# Patient Record
Sex: Male | Born: 1980 | Hispanic: No | Marital: Married | Smoking: Never smoker
Health system: Southern US, Community
[De-identification: ages and names within clinical notes are randomized; demographics above are authoritative.]

## PROBLEM LIST (undated history)

## (undated) DIAGNOSIS — R569 Unspecified convulsions: Secondary | ICD-10-CM

## (undated) DIAGNOSIS — G40909 Epilepsy, unspecified, not intractable, without status epilepticus: Secondary | ICD-10-CM

## (undated) DIAGNOSIS — I3139 Other pericardial effusion (noninflammatory): Secondary | ICD-10-CM

## (undated) DIAGNOSIS — I313 Pericardial effusion (noninflammatory): Secondary | ICD-10-CM

## (undated) DIAGNOSIS — A159 Respiratory tuberculosis unspecified: Secondary | ICD-10-CM

## (undated) HISTORY — PX: OTHER SURGICAL HISTORY: SHX169

---

## 2013-06-28 ENCOUNTER — Emergency Department (HOSPITAL_COMMUNITY)
Admission: EM | Admit: 2013-06-28 | Discharge: 2013-06-28 | Disposition: A | Discharge status: Home / Self Care | Payer: Self-pay | Attending: Emergency Medicine | Admitting: Emergency Medicine

## 2013-06-28 ENCOUNTER — Encounter (HOSPITAL_COMMUNITY): Payer: Self-pay | Admitting: Emergency Medicine

## 2013-06-28 ENCOUNTER — Emergency Department (HOSPITAL_COMMUNITY): Payer: Self-pay

## 2013-06-28 DIAGNOSIS — G40909 Epilepsy, unspecified, not intractable, without status epilepticus: Secondary | ICD-10-CM | POA: Insufficient documentation

## 2013-06-28 DIAGNOSIS — R569 Unspecified convulsions: Secondary | ICD-10-CM

## 2013-06-28 DIAGNOSIS — Z79899 Other long term (current) drug therapy: Secondary | ICD-10-CM | POA: Insufficient documentation

## 2013-06-28 DIAGNOSIS — R000 Tachycardia, unspecified: Secondary | ICD-10-CM | POA: Insufficient documentation

## 2013-06-28 HISTORY — DX: Epilepsy, unspecified, not intractable, without status epilepticus: G40.909

## 2013-06-28 LAB — COMPREHENSIVE METABOLIC PANEL
ALT: 76 U/L — ABNORMAL HIGH (ref 0–53)
AST: 102 U/L — ABNORMAL HIGH (ref 0–37)
Albumin: 3.8 g/dL (ref 3.5–5.2)
Alkaline Phosphatase: 101 U/L (ref 39–117)
CO2: 26 mEq/L (ref 19–32)
Chloride: 108 mEq/L (ref 96–112)
GFR calc non Af Amer: >90 mL/min (ref >90)
Potassium: 3.9 mEq/L (ref 3.5–5.1)
Total Bilirubin: 0.2 mg/dL — ABNORMAL LOW (ref 0.3–1.2)

## 2013-06-28 MED ORDER — SODIUM CHLORIDE 0.9 % IV SOLN
INTRAVENOUS | Status: DC
  Administered 2013-06-28: 21:00:00 via INTRAVENOUS

## 2013-06-28 NOTE — ED Notes (Signed)
Bed: WA12 Expected date:  Expected time:  Means of arrival:  Comments: EMS-syncopal episode 

## 2013-06-28 NOTE — ED Notes (Signed)
Pt BIB EMS. Pt was at home and became altered and nonverbal per EMS. Pt has hx of seizures. No seizure activity noted per EMS. Pt has odor of ETOH on breath. Pt a/o x 4 at present. Pt denies pain. Pt with no acute distress. Skin warm, dry.

## 2013-06-28 NOTE — ED Notes (Signed)
MD at bedside. Dr. Allen at bedside.  

## 2013-06-28 NOTE — ED Provider Notes (Signed)
CSN: [id number]     Arrival date & time 06/28/13  2019 History   First MD Initiated Contact with Patient 06/28/13 2036     Chief Complaint  Patient presents with  . Altered Mental Status   (Consider location/radiation/quality/duration/timing/severity/associated sxs/prior Treatment) Patient is a 32 y.o. male presenting with altered mental status. The history is provided by the patient.  Altered Mental Status  patient here after having what sounds like a seizure prior to arrival. Course the history of seizures. He does take Keppra. No fever or chills. Denies alcohol or illicit drug use. No severe headaches noted. Has had multiple episodes of going unresponsive with a postictal period. Patient denies any recent history of head trauma. No vomiting or neck pain. No photophobia noted. EMS was called and patient presented here. No treatment given prior to arrival.  Past Medical History  Diagnosis Date  . Seizure disorder    No past surgical history on file. No family history on file. History  Substance Use Topics  . Smoking status: Not on file  . Smokeless tobacco: Not on file  . Alcohol Use: Not on file    Review of Systems  All other systems reviewed and are negative.    Allergies  Review of patient's allergies indicates no known allergies.  Home Medications   Current Outpatient Rx  Name  Route  Sig  Dispense  Refill  . levETIRAcetam (KEPPRA) 500 MG tablet   Oral   Take 500 mg by mouth 2 (two) times daily.         . Pseudoeph-Doxylamine-DM-APAP (NYQUIL PO)   Oral   Take 30 mLs by mouth every 6 (six) hours.          BP 122/88  Pulse 107  Temp(Src) 98.3 F (36.8 C) (Oral)  Resp 16  SpO2 100% Physical Exam  Nursing note and vitals reviewed. Constitutional: He is oriented to person, place, and time. He appears well-developed and well-nourished.  Non-toxic appearance. No distress.  HENT:  Head: Normocephalic and atraumatic.  Eyes: Conjunctivae, EOM and lids are  normal. Pupils are equal, round, and reactive to light.  Neck: Normal range of motion. Neck supple. No tracheal deviation present. No mass present.  Cardiovascular: Regular rhythm and normal heart sounds.  Tachycardia present.  Exam reveals no gallop.   No murmur heard. Pulmonary/Chest: Effort normal and breath sounds normal. No stridor. No respiratory distress. He has no decreased breath sounds. He has no wheezes. He has no rhonchi. He has no rales.  Abdominal: Soft. Normal appearance and bowel sounds are normal. He exhibits no distension. There is no tenderness. There is no rebound and no CVA tenderness.  Musculoskeletal: Normal range of motion. He exhibits no edema and no tenderness.  Neurological: He is alert and oriented to person, place, and time. He has normal strength. No cranial nerve deficit or sensory deficit. GCS eye subscore is 4. GCS verbal subscore is 5. GCS motor subscore is 6.  Skin: Skin is warm and dry. No abrasion and no rash noted.  Psychiatric: He has a normal mood and affect. His speech is normal and behavior is normal.    ED Course  Procedures (including critical care time) Labs Review Labs Reviewed - No data to display Imaging Review No results found.  EKG Interpretation   None       MDM  No diagnosis found. 9:10 PM Patients family is now here and he does have a history of seizure disorder. I reviewed a report of  the brain MRI that was performed 2 weeks ago and the patient is being currently evaluated for MS and he is on Keppra and has been compliant with his medications.  10:44 PM Patient without seizure activity here. I have expressed to him take an extra dose of this medication I before he goes to bed and to followup with Dr. next week  Toy Baker, MD 06/28/13 2245

## 2014-10-22 ENCOUNTER — Emergency Department (HOSPITAL_COMMUNITY): Payer: Self-pay

## 2014-10-22 ENCOUNTER — Encounter (HOSPITAL_COMMUNITY): Payer: Self-pay | Admitting: Emergency Medicine

## 2014-10-22 ENCOUNTER — Observation Stay (HOSPITAL_COMMUNITY)
Admission: EM | Admit: 2014-10-22 | Discharge: 2014-10-23 | Disposition: A | Discharge status: Home / Self Care | Payer: Self-pay | Attending: Internal Medicine | Admitting: Internal Medicine

## 2014-10-22 DIAGNOSIS — R531 Weakness: Secondary | ICD-10-CM | POA: Insufficient documentation

## 2014-10-22 DIAGNOSIS — G40909 Epilepsy, unspecified, not intractable, without status epilepticus: Secondary | ICD-10-CM | POA: Insufficient documentation

## 2014-10-22 DIAGNOSIS — I3139 Other pericardial effusion (noninflammatory): Secondary | ICD-10-CM | POA: Diagnosis present

## 2014-10-22 DIAGNOSIS — Z7952 Long term (current) use of systemic steroids: Secondary | ICD-10-CM | POA: Insufficient documentation

## 2014-10-22 DIAGNOSIS — I313 Pericardial effusion (noninflammatory): Secondary | ICD-10-CM | POA: Insufficient documentation

## 2014-10-22 DIAGNOSIS — E876 Hypokalemia: Secondary | ICD-10-CM | POA: Insufficient documentation

## 2014-10-22 DIAGNOSIS — R079 Chest pain, unspecified: Secondary | ICD-10-CM | POA: Diagnosis present

## 2014-10-22 DIAGNOSIS — Z79899 Other long term (current) drug therapy: Secondary | ICD-10-CM | POA: Insufficient documentation

## 2014-10-22 DIAGNOSIS — I10 Essential (primary) hypertension: Secondary | ICD-10-CM | POA: Insufficient documentation

## 2014-10-22 DIAGNOSIS — R569 Unspecified convulsions: Secondary | ICD-10-CM

## 2014-10-22 DIAGNOSIS — R55 Syncope and collapse: Principal | ICD-10-CM | POA: Insufficient documentation

## 2014-10-22 HISTORY — DX: Pericardial effusion (noninflammatory): I31.3

## 2014-10-22 HISTORY — DX: Unspecified convulsions: R56.9

## 2014-10-22 HISTORY — DX: Respiratory tuberculosis unspecified: A15.9

## 2014-10-22 HISTORY — DX: Other pericardial effusion (noninflammatory): I31.39

## 2014-10-22 LAB — CBC WITH DIFFERENTIAL/PLATELET
BASOS ABS: 0 10*3/uL (ref 0.0–0.1)
BASOS PCT: 0 % (ref 0–1)
Eosinophils Absolute: 0 10*3/uL (ref 0.0–0.7)
Eosinophils Relative: 0 % (ref 0–5)
HCT: 42.9 % (ref 39.0–52.0)
Hemoglobin: 14.3 g/dL (ref 13.0–17.0)
LYMPHS PCT: 23 % (ref 12–46)
Lymphs Abs: 1.7 10*3/uL (ref 0.7–4.0)
MCH: 29.9 pg (ref 26.0–34.0)
MCHC: 33.3 g/dL (ref 30.0–36.0)
MCV: 89.6 fL (ref 78.0–100.0)
MONO ABS: 0.6 10*3/uL (ref 0.1–1.0)
Monocytes Relative: 8 % (ref 3–12)
Neutro Abs: 5 10*3/uL (ref 1.7–7.7)
Neutrophils Relative %: 69 % (ref 43–77)
Platelets: 253 10*3/uL (ref 150–400)
RBC: 4.79 MIL/uL (ref 4.22–5.81)
RDW: 13.9 % (ref 11.5–15.5)
WBC: 7.3 10*3/uL (ref 4.0–10.5)

## 2014-10-22 LAB — TROPONIN I

## 2014-10-22 LAB — BASIC METABOLIC PANEL
Anion gap: 13 (ref 5–15)
BUN: 17 mg/dL (ref 6–23)
CALCIUM: 9.2 mg/dL (ref 8.4–10.5)
CO2: 23 mmol/L (ref 19–32)
CREATININE: 0.74 mg/dL (ref 0.50–1.35)
Chloride: 107 mmol/L (ref 96–112)
GFR calc non Af Amer: >90 mL/min (ref >90)
GLUCOSE: 105 mg/dL — AB (ref 70–99)
Potassium: 3.8 mmol/L (ref 3.5–5.1)
Sodium: 143 mmol/L (ref 135–145)

## 2014-10-22 LAB — D-DIMER, QUANTITATIVE (NOT AT ARMC): D DIMER QUANT: 3.27 ug{FEU}/mL — AB (ref 0.00–0.48)

## 2014-10-22 MED ORDER — SODIUM CHLORIDE 0.9 % IV BOLUS (SEPSIS)
1000.0000 mL | Freq: Once | INTRAVENOUS | Status: AC
  Administered 2014-10-22: 1000 mL via INTRAVENOUS

## 2014-10-22 MED ORDER — IOHEXOL 350 MG/ML SOLN
100.0000 mL | Freq: Once | INTRAVENOUS | Status: AC | PRN
  Administered 2014-10-22: 100 mL via INTRAVENOUS

## 2014-10-22 NOTE — ED Notes (Signed)
Pt A&O x 4 at this time, denies any complaints at this time. Pt does report feeling nauseous early this morning and yesterday morning when he first woke up. Pt also reports felt some L chest pain earlier today but it resolved after a few hours. Denies SOB, no nausea or pain at this time. Denies lightheadedness/ dizziness. Able to follow commands.

## 2014-10-22 NOTE — ED Notes (Signed)
Bed: WA03 Expected date:  Expected time:  Means of arrival:  Comments: EMS-seizure 

## 2014-10-22 NOTE — ED Notes (Signed)
Per EMS: Per pt's wife, pt was lying down on floor when he started shaking. Shaking lasted about 5 minutes. Then pt sat up and threw up one time. Pt not post-ictal upon EMS arrival. Pt ambulated to ambulance. Pt's first language is Indie, speaks limited [place].

## 2014-10-22 NOTE — ED Provider Notes (Signed)
CSN: [id number]     Arrival date & time 10/22/14  1835 History   First MD Initiated Contact with Patient 10/22/14 1857     Chief Complaint  Patient presents with  . Seizures     (Consider location/radiation/quality/duration/timing/severity/associated sxs/prior Treatment) HPI  34 year old male presents after a syncopal episode. According to him he was in an argument with a friend and felt lightheaded and had blurry vision. He then passed out. He had transient shaking as well. Patient states this feels different than his typical seizure disorder which is treated with Keppra. He also complains of left-sided chest pain intermittently for the past 2 months. Initially stated he did not have chest pain for the past 2 weeks but friend knows that he was complaining of chest pain around the time of his passing out. Denies any symptoms now states he feels normal. States [facility] and had to have fluid drawn off his heart. He presents to be some type work shows that he had cardiac Denied. Patient states this feels different than that.  Past Medical History  Diagnosis Date  . Seizure disorder    History reviewed. No pertinent past surgical history. No family history on file. History  Substance Use Topics  . Smoking status: Never Smoker   . Smokeless tobacco: Not on file  . Alcohol Use: No    Review of Systems  Constitutional: Negative for fever.  Respiratory: Negative for shortness of breath.   Cardiovascular: Positive for chest pain.  Gastrointestinal: Positive for vomiting. Negative for abdominal pain.  Neurological: Positive for syncope and light-headedness. Negative for weakness.  All other systems reviewed and are negative.     Allergies  Review of patient's allergies indicates no known allergies.  Home Medications   Prior to Admission medications   Medication Sig Start Date End Date Taking? Authorizing Provider  colchicine 0.6 MG tablet Take 0.6 mg by mouth  daily.   Yes Historical Provider, MD  levETIRAcetam (KEPPRA) 500 MG tablet Take 500-1,000 mg by mouth 2 (two) times daily. Take 1 tablet in the morning and Take 2 tablets in the pm.   Yes Historical Provider, MD  lisinopril (PRINIVIL,ZESTRIL) 5 MG tablet Take 2.5 mg by mouth daily.   Yes Historical Provider, MD  metoprolol (LOPRESSOR) 50 MG tablet Take 50 mg by mouth 2 (two) times daily.   Yes Historical Provider, MD  predniSONE (DELTASONE) 10 MG tablet Take 20 mg by mouth daily with breakfast.   Yes Historical Provider, MD  Pseudoeph-Doxylamine-DM-APAP (NYQUIL PO) Take 30 mLs by mouth every 8 (eight) hours.    Yes Historical Provider, MD   BP 101/64 mmHg  Pulse 110  Temp(Src) 98.4 F (36.9 C) (Oral)  Resp 22  SpO2 94% Physical Exam  Constitutional: He is oriented to person, place, and time. He appears well-developed and well-nourished.  HENT:  Head: Normocephalic and atraumatic.  Right Ear: External ear normal.  Left Ear: External ear normal.  Nose: Nose normal.  Eyes: EOM are normal. Pupils are equal, round, and reactive to light. Right eye exhibits no discharge. Left eye exhibits no discharge.  Neck: Neck supple.  Cardiovascular: Normal rate, regular rhythm, normal heart sounds and intact distal pulses.   Pulmonary/Chest: Effort normal and breath sounds normal. He has no wheezes. He has no rales.  Abdominal: Soft. He exhibits no distension. There is no tenderness.  Musculoskeletal: He exhibits no edema.  Neurological: He is alert and oriented to person, place, and time.  CN 2-12 grossly intact. 5/5 strength in all 4 extremities  Skin: Skin is warm and dry.  Nursing note and vitals reviewed.   ED Course  Procedures (including critical care time) Labs Review Labs Reviewed  BASIC METABOLIC PANEL - Abnormal; Notable for the following:    Glucose, Bld 105 (*)    All other components within normal limits  D-DIMER, QUANTITATIVE - Abnormal; Notable for the following:    D-Dimer,  Quant 3.27 (*)    All other components within normal limits  CBC WITH DIFFERENTIAL/PLATELET  TROPONIN I    Imaging Review Dg Chest 2 View  10/22/2014   CLINICAL DATA:  Syncope.  Emesis.  EXAM: CHEST  2 VIEW  COMPARISON:  None.  FINDINGS: The cardiomediastinal silhouette is within normal limits. The lungs are well inflated. There is mild, asymmetric parenchymal lung opacity in the right lung apex, likely with some associated pleural thickening. There is blunting of the left costophrenic angle consistent with a small left pleural effusion. No left lung consolidation is seen. No pneumothorax. No acute osseous abnormality identified.  IMPRESSION: 1. Small left pleural effusion. 2. Asymmetric, mild pleural parenchymal opacities in the right lung apex, most likely scarring related to prior infection. Attention on follow-up. Comparison with any prior studies, if available, would also be helpful to assess stability.   Electronically Signed   By: Sebastian Ache   On: 10/22/2014 20:11   Ct Angio Chest Pe W/cm &/or Wo Cm  10/22/2014   CLINICAL DATA:  Syncopal episode today. Irregular heartbeat and low blood pressure. History of pericardial effusion.  EXAM: CT ANGIOGRAPHY CHEST WITH CONTRAST  TECHNIQUE: Multidetector CT imaging of the chest was performed using the standard protocol during bolus administration of intravenous contrast. Multiplanar CT image reconstructions and MIPs were obtained to evaluate the vascular anatomy.  CONTRAST:  OMNIPAQUE IOHEXOL 350 MG/ML SOLN  COMPARISON:  Chest radiographs earlier today  FINDINGS: Pulmonary arterial opacification is adequate without evidence of emboli. Heart is normal in size. There is a small pericardial effusion. There is a small left pleural effusion. No enlarged axillary lymph nodes are identified. Small calcified mediastinal lymph nodes are noted. No enlarged hilar lymph nodes are seen.  There is rather extensive tree-in-bud nodularity in the apical and posterior  segments of the right upper lobe. There is also some nodular peribronchovascular opacity more centrally in the right upper lobe, and there are branching, tubular densities more peripherally in the apical right upper lobe suggestive of regions of mucoid impaction with associated bronchiolectasis. Two 5 mm nodules are noted along the right major and right minor fissures (series 11, images 48 and 50). A small area of tree-in-bud nodularity is noted in the anterior left upper lobe. Left lower lobe opacity along the left hemidiaphragm likely represents atelectasis.  Visualized portion of the upper abdomen is unremarkable. No acute osseous abnormality is identified.  Review of the MIP images confirms the above findings.  IMPRESSION: 1. No evidence of pulmonary emboli. 2. Areas of mucoid impaction and tree-in-bud nodularity involving the right greater than left upper lobes potentially reflecting endobronchial infection. 3. Small left pleural effusion. 4. Small pericardial effusion.   Electronically Signed   By: Sebastian Ache   On: 10/22/2014 22:43     EKG Interpretation   Date/Time:  Monday October 22 2014 18:42:33 EDT Ventricular Rate:  118 PR Interval:  114 QRS Duration: 85 QT Interval:  299 QTC Calculation: 419 R Axis:   78 Text Interpretation:  Sinus tachycardia  Abnormal T, consider ischemia,  diffuse leads No old tracing to compare Confirmed by Kenneth Cuaresma  MD, Nadege Carriger  ([id number]) on 10/22/2014 6:59:10 PM      MDM   Final diagnoses:  Syncope, unspecified syncope type    Patient stable here, has tachycardia that has improved after fluids. Given tachycardia with syncope and transient CP, workup for PE pursued and is negative. 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 Had drain placed. Also had thoracentesis. No EKG from their facility, [facility] states one was never done. Thus no old to compare the T wave inversions seen. Given CP and EKG, will need  admission for rule out and further workup. He claims he took his meds as instructed, no antibiotics seen on his current med list. No cough or hemoptysis. Will admit to telemetry on negative pressure per hospitalist.     Pricilla Loveless, MD 10/23/14 [phone]

## 2014-10-23 ENCOUNTER — Encounter (HOSPITAL_COMMUNITY): Payer: Self-pay | Admitting: Internal Medicine

## 2014-10-23 DIAGNOSIS — R55 Syncope and collapse: Secondary | ICD-10-CM | POA: Diagnosis present

## 2014-10-23 DIAGNOSIS — I3139 Other pericardial effusion (noninflammatory): Secondary | ICD-10-CM | POA: Diagnosis present

## 2014-10-23 DIAGNOSIS — I313 Pericardial effusion (noninflammatory): Secondary | ICD-10-CM | POA: Diagnosis present

## 2014-10-23 DIAGNOSIS — E876 Hypokalemia: Secondary | ICD-10-CM | POA: Insufficient documentation

## 2014-10-23 DIAGNOSIS — I10 Essential (primary) hypertension: Secondary | ICD-10-CM | POA: Insufficient documentation

## 2014-10-23 DIAGNOSIS — R079 Chest pain, unspecified: Secondary | ICD-10-CM | POA: Diagnosis present

## 2014-10-23 DIAGNOSIS — R569 Unspecified convulsions: Secondary | ICD-10-CM

## 2014-10-23 LAB — COMPREHENSIVE METABOLIC PANEL
ALBUMIN: 3.2 g/dL — AB (ref 3.5–5.2)
ALT: 20 U/L (ref 0–53)
AST: 19 U/L (ref 0–37)
Alkaline Phosphatase: 84 U/L (ref 39–117)
Anion gap: 9 (ref 5–15)
BUN: 14 mg/dL (ref 6–23)
CALCIUM: 8 mg/dL — AB (ref 8.4–10.5)
CO2: 26 mmol/L (ref 19–32)
CREATININE: 0.67 mg/dL (ref 0.50–1.35)
Chloride: 106 mmol/L (ref 96–112)
GFR calc Af Amer: >90 mL/min (ref >90)
GFR calc non Af Amer: >90 mL/min (ref >90)
Glucose, Bld: 106 mg/dL — ABNORMAL HIGH (ref 70–99)
Potassium: 3.4 mmol/L — ABNORMAL LOW (ref 3.5–5.1)
Sodium: 141 mmol/L (ref 135–145)
Total Bilirubin: 0.5 mg/dL (ref 0.3–1.2)
Total Protein: 6.5 g/dL (ref 6.0–8.3)

## 2014-10-23 LAB — TROPONIN I: Troponin I: <0.03 ng/mL (ref <0.031)

## 2014-10-23 LAB — CBC WITH DIFFERENTIAL/PLATELET
Basophils Absolute: 0 10*3/uL (ref 0.0–0.1)
Basophils Relative: 1 % (ref 0–1)
EOS ABS: 0.1 10*3/uL (ref 0.0–0.7)
EOS PCT: 1 % (ref 0–5)
HCT: 37.9 % — ABNORMAL LOW (ref 39.0–52.0)
HEMOGLOBIN: 12.4 g/dL — AB (ref 13.0–17.0)
LYMPHS ABS: 1.6 10*3/uL (ref 0.7–4.0)
Lymphocytes Relative: 34 % (ref 12–46)
MCH: 29.9 pg (ref 26.0–34.0)
MCHC: 32.7 g/dL (ref 30.0–36.0)
MCV: 91.3 fL (ref 78.0–100.0)
MONO ABS: 0.5 10*3/uL (ref 0.1–1.0)
Monocytes Relative: 10 % (ref 3–12)
NEUTROS PCT: 54 % (ref 43–77)
Neutro Abs: 2.5 10*3/uL (ref 1.7–7.7)
Platelets: 200 10*3/uL (ref 150–400)
RBC: 4.15 MIL/uL — ABNORMAL LOW (ref 4.22–5.81)
RDW: 14.2 % (ref 11.5–15.5)
WBC: 4.6 10*3/uL (ref 4.0–10.5)

## 2014-10-23 MED ORDER — ONDANSETRON HCL 4 MG/2ML IJ SOLN: 4.0000 mg | Freq: Four times a day (QID) | INTRAMUSCULAR | Status: DC | PRN

## 2014-10-23 MED ORDER — METOPROLOL TARTRATE 50 MG PO TABS
50.0000 mg | ORAL_TABLET | Freq: Two times a day (BID) | ORAL | Status: DC
  Administered 2014-10-23 (×2): 50 mg via ORAL
  Filled 2014-10-23 (×2): qty 1

## 2014-10-23 MED ORDER — LISINOPRIL 2.5 MG PO TABS
2.5000 mg | ORAL_TABLET | Freq: Every day | ORAL | Status: DC
  Administered 2014-10-23: 2.5 mg via ORAL
  Filled 2014-10-23: qty 1

## 2014-10-23 MED ORDER — COLCHICINE 0.6 MG PO TABS
0.6000 mg | ORAL_TABLET | Freq: Every day | ORAL | Status: DC
  Administered 2014-10-23: 0.6 mg via ORAL
  Filled 2014-10-23: qty 1

## 2014-10-23 MED ORDER — METOPROLOL TARTRATE 25 MG PO TABS: 25.0000 mg | ORAL_TABLET | Freq: Two times a day (BID) | ORAL | Status: AC

## 2014-10-23 MED ORDER — ETHAMBUTOL HCL 400 MG PO TABS
800.0000 mg | ORAL_TABLET | Freq: Once | ORAL | Status: AC
  Administered 2014-10-23: 800 mg via ORAL
  Filled 2014-10-23: qty 2

## 2014-10-23 MED ORDER — ONDANSETRON HCL 4 MG PO TABS: 4.0000 mg | ORAL_TABLET | Freq: Four times a day (QID) | ORAL | Status: DC | PRN

## 2014-10-23 MED ORDER — SODIUM CHLORIDE 0.9 % IV SOLN: INTRAVENOUS | Status: DC

## 2014-10-23 MED ORDER — LEVETIRACETAM 500 MG PO TABS
500.0000 mg | ORAL_TABLET | Freq: Every day | ORAL | Status: DC
  Administered 2014-10-23: 500 mg via ORAL
  Filled 2014-10-23: qty 1

## 2014-10-23 MED ORDER — ACETAMINOPHEN 325 MG PO TABS: 650.0000 mg | ORAL_TABLET | Freq: Four times a day (QID) | ORAL | Status: DC | PRN

## 2014-10-23 MED ORDER — RIFAMPIN 300 MG PO CAPS
600.0000 mg | ORAL_CAPSULE | Freq: Once | ORAL | Status: AC
  Administered 2014-10-23: 600 mg via ORAL
  Filled 2014-10-23: qty 2

## 2014-10-23 MED ORDER — PYRAZINAMIDE 500 MG PO TABS
1000.0000 mg | ORAL_TABLET | Freq: Once | ORAL | Status: AC
  Administered 2014-10-23: 1000 mg via ORAL
  Filled 2014-10-23: qty 2

## 2014-10-23 MED ORDER — PREDNISONE 20 MG PO TABS
20.0000 mg | ORAL_TABLET | Freq: Every day | ORAL | Status: DC
  Administered 2014-10-23: 20 mg via ORAL
  Filled 2014-10-23: qty 1

## 2014-10-23 MED ORDER — ACETAMINOPHEN 650 MG RE SUPP: 650.0000 mg | Freq: Four times a day (QID) | RECTAL | Status: DC | PRN

## 2014-10-23 MED ORDER — ISONIAZID 300 MG PO TABS
300.0000 mg | ORAL_TABLET | Freq: Once | ORAL | Status: AC
  Administered 2014-10-23: 300 mg via ORAL
  Filled 2014-10-23: qty 1

## 2014-10-23 MED ORDER — LEVETIRACETAM 500 MG PO TABS: 1000.0000 mg | ORAL_TABLET | Freq: Every day | ORAL | Status: DC

## 2014-10-23 MED ORDER — VITAMIN B-6 50 MG PO TABS
50.0000 mg | ORAL_TABLET | Freq: Once | ORAL | Status: AC
  Administered 2014-10-23: 50 mg via ORAL
  Filled 2014-10-23: qty 1

## 2014-10-23 NOTE — Progress Notes (Signed)
Patient has home medications at bedside.  Patient and family member educated on not taking home medications while here in hospital unless our pharmacy dispenses medications.   Patient and family member told to either take medications home or RN would take them to be stored in pharmacy, but could not keep medications at bedside.  Patient and wife verbalized understanding and patient's wife states " I will take them home this morning."  Patient states " I will not take any of them unless you give them to me and I have not taken any of them since I have been here at the hospital."  Will continue to monitor patient.

## 2014-10-23 NOTE — ED Notes (Signed)
Transfer to floor delayed because pt needs to be placed in a negative pressure room upon admission.

## 2014-10-23 NOTE — Discharge Instructions (Signed)
Near-Syncope Near-syncope (commonly known as near fainting) is sudden weakness, dizziness, or feeling like you might pass out. During an episode of near-syncope, you may also develop pale skin, have tunnel vision, or feel sick to your stomach (nauseous). Near-syncope may occur when getting up after sitting or while standing for a long time. It is caused by a sudden decrease in blood flow to the brain. This decrease can result from various causes or triggers, most of which are not serious. However, because near-syncope can sometimes be a sign of something serious, a medical evaluation is required. The specific cause is often not determined. HOME CARE INSTRUCTIONS  Monitor your condition for any changes. The following actions may help to alleviate any discomfort you are experiencing:  Have someone stay with you until you feel stable.  Lie down right away and prop your feet up if you start feeling like you might faint. Breathe deeply and steadily. Wait until all the symptoms have passed. Most of these episodes last only a few minutes. You may feel tired for several hours.   Drink enough fluids to keep your urine clear or pale yellow.   If you are taking blood pressure or heart medicine, get up slowly when seated or lying down. Take several minutes to sit and then stand. This can reduce dizziness.  Follow up with your health care provider as directed. SEEK IMMEDIATE MEDICAL CARE IF:   You have a severe headache.   You have unusual pain in the chest, abdomen, or back.   You are bleeding from the mouth or rectum, or you have black or tarry stool.   You have an irregular or very fast heartbeat.   You have repeated fainting or have seizure-like jerking during an episode.   You faint when sitting or lying down.   You have confusion.   You have difficulty walking.   You have severe weakness.   You have vision problems.  MAKE SURE YOU:   Understand these instructions.  Will  watch your condition.  Will get help right away if you are not doing well or get worse. Document Released: 06/22/2005 Document Revised: 06/27/2013 Document Reviewed: 11/25/2012 [employer] Patient Information 2015 [employer]. This information is not intended to replace advice given to you by your health care provider. Make sure you discuss any questions you have with your health care provider.  

## 2014-10-23 NOTE — Discharge Summary (Signed)
Physician Discharge Summary  Tristan Dickson WUJ:[id number] DOB: 1981/06/25 DOA: 10/22/2014  PCP: Dr. Dalbert Batman   Admit date: 10/22/2014 Discharge date: 10/23/2014  Recommendations for Outpatient Follow-up:  1. Please note we have stopped lisinopril temporarily since blood pressure is 99/56 - 107/67. Patient advised to take metoprolol 25 mg twice daily instead of 50 mg twice daily. Patient instructed if blood pressure is 120/80 or above that he can take medications as previously prescribed.  Discharge Diagnoses:  Principal Problem:   Near syncope Active Problems:   Chest pain   Pericardial effusion   Seizure    Discharge Condition: stable; patient insists on going home today. He did not want to have 2-D echo done because he had all cardiac studies done about a week prior to this admission with his cardiologist.  Diet recommendation: as tolerated   History of present illness:  34 y.o. male with history of seizure disorder, TB pericarditis and cardiac tamponade ([facility] and patient had pericardial drain placed which was removed after 3 days and patient's pericardial fluid was positive for AFB, sputum was negative for AFB. Patient was discharged home on Rifampin, INH, Ethambutol and Pyrazinamide under direct observation of [facility]). He has cardiologist in [place] who he usually follow with and they are adjusting his medications.  Pt presented to [facility] with near syncope. He apparently ate [place] foot the night prior to admission and then the left over the following day. He then felt weak and started to develop chest pain and then he vomited. In regards to chest pain, he felt mid sternal pressure which lasted for 5 minutes, non radiating, started at rest and spontaneously resolved. Pain was sharp in quality and constant for that 5 minute duration.  In the ER patient has sinus tachycardia in the EKG with T-wave changes in inferolateral leads.  D-dimer was elevated and CT angiography of the chest was done which showed tree-in-bud appearance with mild pericardial effusion and left pleural effusion.    Hospital Course:  Principal Problem:   Near syncope - Likely vasovagal fro GI losses / vomiting - Pt feels fine now and he insists on going home - He decline to have ECHO on this admission since he reported he had recent one which was apparently normal - He was given IV fluids in ED.  - He was instructed to reduce dose of metoprolol due to soft BP and to temporarily hold lisinopril until BP improves.  Active Problems:   Chest pain - Resolved, likely musculoskeletal - Troponin x 3 negative    Pericardial effusion / tuberculous pericarditis - Continue TB meds as per prior to the admission    Seizure - Continue Keppra - No reports of seizures.   Signed:  Manson Passey, MD  [facility] 10/23/2014, 9:46 AM  Pager #: [phone]  Time spent in minutes: less than 30 minutes  Procedures:  None   Consultations:  None   Discharge Exam: Filed Vitals:   10/23/14 0455  BP: 99/56  Pulse: 104  Temp: 98.7 F (37.1 C)  Resp: 20   Filed Vitals:   10/22/14 2322 10/23/14 0206 10/23/14 0207 10/23/14 0455  BP: 107/67  95/58 99/56  Pulse: 102  102 104  Temp:   98 F (36.7 C) 98.7 F (37.1 C)  TempSrc:   Oral Oral  Resp: Height:   (1.626 m)    Weight:  53.978 kg (119 lb)  SpO2: 100%  98% 99%    General: Pt is alert, follows commands appropriately, not in acute distress Cardiovascular: Regular rate and rhythm, S1/S2 +, no murmurs Respiratory: Clear to auscultation bilaterally, no wheezing, no crackles, no rhonchi Abdominal: Soft, non tender, non distended, bowel sounds +, no guarding Extremities: no edema, no cyanosis, pulses palpable bilaterally DP and PT Neuro: Grossly nonfocal  Discharge Instructions  Discharge Instructions    Call MD for:  difficulty breathing, headache or visual  disturbances    Complete by:  As directed      Call MD for:  persistant nausea and vomiting    Complete by:  As directed      Call MD for:  severe uncontrolled pain    Complete by:  As directed      Diet - low sodium heart healthy    Complete by:  As directed      Discharge instructions    Complete by:  As directed   1. Please continue TB meds per health department as per prior to this admission. 2. Please note we have decreased metoprolol to 25 mg twice a day because your blood pressure is 99/56. Hold lisinopril for now until your blood pressure is at least 120/80. If your blood pressure is stable please then continue your current medications and follow up with cardiology per scheduled appt.     Increase activity slowly    Complete by:  As directed             Medication List    STOP taking these medications        lisinopril 5 MG tablet  Commonly known as:  PRINIVIL,ZESTRIL      TAKE these medications        colchicine 0.6 MG tablet  Take 0.6 mg by mouth daily.     levETIRAcetam 500 MG tablet  Commonly known as:  KEPPRA  Take 500-1,000 mg by mouth 2 (two) times daily. Take 1 tablet in the morning and Take 2 tablets in the pm.     metoprolol tartrate 25 MG tablet  Commonly known as:  LOPRESSOR  Take 1 tablet (25 mg total) by mouth 2 (two) times daily.     NYQUIL PO  Take 30 mLs by mouth every 8 (eight) hours.     predniSONE 10 MG tablet  Commonly known as:  DELTASONE  Take 20 mg by mouth daily with breakfast.          The results of significant diagnostics from this hospitalization (including imaging, microbiology, ancillary and laboratory) are listed below for reference.    Significant Diagnostic Studies: Dg Chest 2 View  10/22/2014   CLINICAL DATA:  Syncope.  Emesis.  EXAM: CHEST  2 VIEW  COMPARISON:  None.  FINDINGS: The cardiomediastinal silhouette is within normal limits. The lungs are well inflated. There is mild, asymmetric parenchymal lung opacity in  the right lung apex, likely with some associated pleural thickening. There is blunting of the left costophrenic angle consistent with a small left pleural effusion. No left lung consolidation is seen. No pneumothorax. No acute osseous abnormality identified.  IMPRESSION: 1. Small left pleural effusion. 2. Asymmetric, mild pleural parenchymal opacities in the right lung apex, most likely scarring related to prior infection. Attention on follow-up. Comparison with any prior studies, if available, would also be helpful to assess stability.   Electronically Signed   By: Sebastian Ache   On: 10/22/2014 20:11   Ct Angio Chest Pe W/cm &/  or Wo Cm  10/22/2014   CLINICAL DATA:  Syncopal episode today. Irregular heartbeat and low blood pressure. History of pericardial effusion.  EXAM: CT ANGIOGRAPHY CHEST WITH CONTRAST  TECHNIQUE: Multidetector CT imaging of the chest was performed using the standard protocol during bolus administration of intravenous contrast. Multiplanar CT image reconstructions and MIPs were obtained to evaluate the vascular anatomy.  CONTRAST:  100mL OMNIPAQUE IOHEXOL 350 MG/ML SOLN  COMPARISON:  Chest radiographs earlier today  FINDINGS: Pulmonary arterial opacification is adequate without evidence of emboli. Heart is normal in size. There is a small pericardial effusion. There is a small left pleural effusion. No enlarged axillary lymph nodes are identified. Small calcified mediastinal lymph nodes are noted. No enlarged hilar lymph nodes are seen.  There is rather extensive tree-in-bud nodularity in the apical and posterior segments of the right upper lobe. There is also some nodular peribronchovascular opacity more centrally in the right upper lobe, and there are branching, tubular densities more peripherally in the apical right upper lobe suggestive of regions of mucoid impaction with associated bronchiolectasis. Two 5 mm nodules are noted along the right major and right minor fissures (series 11,  images 48 and 50). A small area of tree-in-bud nodularity is noted in the anterior left upper lobe. Left lower lobe opacity along the left hemidiaphragm likely represents atelectasis.  Visualized portion of the upper abdomen is unremarkable. No acute osseous abnormality is identified.  Review of the MIP images confirms the above findings.  IMPRESSION: 1. No evidence of pulmonary emboli. 2. Areas of mucoid impaction and tree-in-bud nodularity involving the right greater than left upper lobes potentially reflecting endobronchial infection. 3. Small left pleural effusion. 4. Small pericardial effusion.   Electronically Signed   By: Sebastian AcheAllen  Grady   On: 10/22/2014 22:43    Microbiology: No results found for this or any previous visit (from the past 240 hour(s)).   Labs: Basic Metabolic Panel:  Recent Labs Lab 10/22/14 1944 10/23/14 0225  NA 143 141  K 3.8 3.4*  CL 107 106  CO2 23 26  GLUCOSE 105* 106*  BUN 17 14  CREATININE 0.74 0.67  CALCIUM 9.2 8.0*   Liver Function Tests:  Recent Labs Lab 10/23/14 0225  AST 19  ALT 20  ALKPHOS 84  BILITOT 0.5  PROT 6.5  ALBUMIN 3.2*   No results for input(s): LIPASE, AMYLASE in the last 168 hours. No results for input(s): AMMONIA in the last 168 hours. CBC:  Recent Labs Lab 10/22/14 1944 10/23/14 0225  WBC 7.3 4.6  NEUTROABS 5.0 2.5  HGB 14.3 12.4*  HCT 42.9 37.9*  MCV 89.6 91.3  PLT 253 200   Cardiac Enzymes:  Recent Labs Lab 10/22/14 1944 10/23/14 0225 10/23/14 0817  TROPONINI <0.03 <0.03 <0.03   BNP: BNP (last 3 results) No results for input(s): BNP in the last 8760 hours.  ProBNP (last 3 results) No results for input(s): PROBNP in the last 8760 hours.  CBG: No results for input(s): GLUCAP in the last 168 hours.

## 2014-10-23 NOTE — Progress Notes (Signed)
UR completed 

## 2014-10-23 NOTE — H&P (Addendum)
[facility] History and Physical  Arnette SchaumannDipen Cushman ZOX:[id number] DOB: 1981/02/07 DOA: 10/22/2014  Referring physician: Malachi Carlr.[place]. ER physician. PCP: No primary care provider on file. patient follows up with cardiologist and neurologist at [facility].  Chief Complaint: Weakness.  HPI: Tristan Dickson is a 34 y.o. 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 As per patient he had some [place] food night before last and also had some yesterday afternoon which was some leftover food. Later in the evening patient felt uncomfortable and weak. Patient threw up following which patient started developing chest pain which was pressure-like and lasted for 5 minutes and patient also had weakness and fatigue and nearly lost consciousness but did not. Patient's friend who was by his side states that patient had felt weak but did not lose consciousness and did not have any seizure-like activities or any tongue bite or incontinence of urine. After half an hour patient felt better but still weak and came to the ER. In the ER patient has sinus tachycardia in the EKG with T-wave changes in inferolateral leads. D-dimer was elevated and CT angiography of the chest was done which showed tree-in-bud appearance with mild pericardial effusion and left pleural effusion. Last month when patient was admitted at [facility] patient was found to be in cardiac tamponade and patient had pericardial drain placed. Pericardial drain was removed after 3 days and patient's pericardial fluid was positive for AFB and MTB NAT was positive also. Sputum was negative for any AFB. Patient also had a seizure during and was followed by neurologist and had lumbar point an MRI done. Lumbar puncture was unremarkable as per the discharge summary obtained from [facility].  Initially patient's Keppra dose was increased but later reduced to home dose. Patient was discharged home on rifampin INH, ethambutol and Pyrazinamide [facility]. Patient states he has been taking medications as advised. In addition for his tuberculous pericarditis patient is on colchicine and prednisone tapering dose. During yesterday patient's initial echocardiogram showed EF of 30-35% and patient was placed on lisinopril and metoprolol and repeat 2-D echo at the time of discharge was normal as per the reports. Patient also had elevated LFTs which was attributed to possible condition due to the cardiac tamponade. Patient on my exam presently is chest pain-free denies any shortness of breath abdomen is benign on exam.   Review of Systems: As presented in the history of presenting illness, rest negative.  Past Medical History  Diagnosis Date  . Seizure disorder   . Tuberculosis   . Seizures   . Pericardial effusion    Past Surgical History  Procedure Laterality Date  . Pericardiocentesis     Social History:  reports that he has never smoked. He does not have any smokeless tobacco history on file. He reports that he does not drink alcohol or use illicit drugs. Where does patient live home. Can patient participate in ADLs? Yes.  No Known Allergies  Family History:  Family History  Problem Relation Age of Onset  . Seizures Paternal Grandmother   . Stroke Paternal Grandmother       Prior to Admission medications   Medication Sig Start Date End Date Taking? Authorizing Provider  colchicine 0.6 MG tablet Take 0.6 mg by mouth daily.   Yes Historical Provider, MD  levETIRAcetam (KEPPRA) 500 MG tablet Take 500-1,000 mg by mouth 2 (two) times daily. Take 1 tablet in the morning and Take 2 tablets in the pm.   Yes Historical Provider, MD  lisinopril (PRINIVIL,ZESTRIL) 5 MG tablet Take 2.5 mg by mouth daily.   Yes Historical Provider, MD  metoprolol  (LOPRESSOR) 50 MG tablet Take 50 mg by mouth 2 (two) times daily.   Yes Historical Provider, MD  predniSONE (DELTASONE) 10 MG tablet Take 20 mg by mouth daily with breakfast.   Yes Historical Provider, MD  Pseudoeph-Doxylamine-DM-APAP (NYQUIL PO) Take 30 mLs by mouth every 8 (eight) hours.    Yes Historical Provider, MD    Physical Exam: Filed Vitals:   10/22/14 1840 10/22/14 1841 10/22/14 2058 10/22/14 2322  BP:  101/64 95/59 107/67  Pulse:  110 102 102  Temp:  98.4 F (36.9 C)    TempSrc:  Oral    Resp:  SpO2: 100% 94% 97% 100%     General:  Moderately built and nourished.  Eyes: Anicteric no pallor.  ENT: No discharge from the ears eyes nose and mouth.  Neck: No mass felt. No neck rigidity.  Cardiovascular: S1-S2 heard.  Respiratory: No rhonchi or crepitations.  Abdomen: Soft nontender bowel sounds present.  Skin: No rash.  Musculoskeletal: No edema.  Psychiatric: Appears normal.  Neurologic: Alert awake oriented to time place and person. Moves all extremities.  Labs on Admission:  Basic Metabolic Panel:  Recent Labs Lab 10/22/14 1944  NA 143  K 3.8  CL 107  CO2 23  GLUCOSE 105*  BUN 17  CREATININE 0.74  CALCIUM 9.2   Liver Function Tests: No results for input(s): AST, ALT, ALKPHOS, BILITOT, PROT, ALBUMIN in the last 168 hours. No results for input(s): LIPASE, AMYLASE in the last 168 hours. No results for input(s): AMMONIA in the last 168 hours. CBC:  Recent Labs Lab 10/22/14 1944  WBC 7.3  NEUTROABS 5.0  HGB 14.3  HCT 42.9  MCV 89.6  PLT 253   Cardiac Enzymes:  Recent Labs Lab 10/22/14 1944  TROPONINI <0.03    BNP (last 3 results) No results for input(s): BNP in the last 8760 hours.  ProBNP (last 3 results) No results for input(s): PROBNP in the last 8760 hours.  CBG: No results for input(s): GLUCAP in the last 168 hours.  Radiological Exams on Admission: Dg Chest 2 View  10/22/2014   CLINICAL DATA:  Syncope.   Emesis.  EXAM: CHEST  2 VIEW  COMPARISON:  None.  FINDINGS: The cardiomediastinal silhouette is within normal limits. The lungs are well inflated. There is mild, asymmetric parenchymal lung opacity in the right lung apex, likely with some associated pleural thickening. There is blunting of the left costophrenic angle consistent with a small left pleural effusion. No left lung consolidation is seen. No pneumothorax. No acute osseous abnormality identified.  IMPRESSION: 1. Small left pleural effusion. 2. Asymmetric, mild pleural parenchymal opacities in the right lung apex, most likely scarring related to prior infection. Attention on follow-up. Comparison with any prior studies, if available, would also be helpful to assess stability.   Electronically Signed   By: Sebastian Ache   On: 10/22/2014 20:11   Ct Angio Chest Pe W/cm &/or Wo Cm  10/22/2014   CLINICAL DATA:  Syncopal episode today. Irregular heartbeat and low blood pressure. History of pericardial effusion.  EXAM: CT ANGIOGRAPHY CHEST WITH CONTRAST  TECHNIQUE: Multidetector CT imaging of the chest was performed  using the standard protocol during bolus administration of intravenous contrast. Multiplanar CT image reconstructions and MIPs were obtained to evaluate the vascular anatomy.  CONTRAST:  OMNIPAQUE IOHEXOL 350 MG/ML SOLN  COMPARISON:  Chest radiographs earlier today  FINDINGS: Pulmonary arterial opacification is adequate without evidence of emboli. Heart is normal in size. There is a small pericardial effusion. There is a small left pleural effusion. No enlarged axillary lymph nodes are identified. Small calcified mediastinal lymph nodes are noted. No enlarged hilar lymph nodes are seen.  There is rather extensive tree-in-bud nodularity in the apical and posterior segments of the right upper lobe. There is also some nodular peribronchovascular opacity more centrally in the right upper lobe, and there are branching, tubular densities more  peripherally in the apical right upper lobe suggestive of regions of mucoid impaction with associated bronchiolectasis. Two 5 mm nodules are noted along the right major and right minor fissures (series 11, images 48 and 50). A small area of tree-in-bud nodularity is noted in the anterior left upper lobe. Left lower lobe opacity along the left hemidiaphragm likely represents atelectasis.  Visualized portion of the upper abdomen is unremarkable. No acute osseous abnormality is identified.  Review of the MIP images confirms the above findings.  IMPRESSION: 1. No evidence of pulmonary emboli. 2. Areas of mucoid impaction and tree-in-bud nodularity involving the right greater than left upper lobes potentially reflecting endobronchial infection. 3. Small left pleural effusion. 4. Small pericardial effusion.   Electronically Signed   By: Sebastian Ache   On: 10/22/2014 22:43    EKG: Independently reviewed. Sinus tachycardia with inferolateral T-wave changes.  Assessment/Plan Principal Problem:   Near syncope Active Problems:   Chest pain   Pericardial effusion   Seizure   1. Near syncope - could be secondary vasovagal episode after patient vomited. At this time we will closely monitor in telemetry. On-call cardiologist Dr. Ace Gins was consulted by ER physician and at this time to have requested admission and observation. Recheck 2-D echo. 2. Chest pain - currently chest pain-free. We will cycle cardiac markers given EKG changes and no old EKG to compare. Check 2-D echo. 3. Nausea vomiting - patient had one episode of nausea and vomiting. Abdomen appears benign at this time. Recent sonogram of the abdomen done at [facility] reports states that patient had hepatic steatosis and some ascites. LFTs at that time was elevated probably from congestion secondary to tamponade. Patient is also an INH and was advised to follow-up LFTs closely. At this time I have ordered LFTs and closely observe. 4. Recent treatment  for cardiac tamponade and TB pericarditis - patient's CT chest shows tree-in-bud appearance. Patient is presently placed on airborne precautions. Continue patient's antituberculous medications and prednisone taper and colchicine. Continue patient's metoprolol and lisinopril. I have discussed pharmacist about patient's antituberculous medications to be continued. May discuss with infectious disease in a.m. for further recommendations on discontinuing airborne precautions. Patient does not have any active cough or fever at this time. 5. Seizure disorder - as per the bystanders patient did not have any seizure-like activities today. Continue Keppra and closely observed.  I have reviewed patient's discharge summary from [facility]. I have personally reviewed the patient's chest x-ray and EKG.   DVT Prophylaxis SCDs given that patient has recent pericarditis.  Code Status: Full code.  Family Communication: None.  Disposition Plan: Admit for observation.    Adylee Leonardo N. [facility] Pager [phone].  If 7PM-7AM, please contact night-coverage [email or web address]  Password [email or web address] 10/23/2014, 1:52 AM

## 2014-10-23 NOTE — Progress Notes (Signed)
Per Dr. Elisabeth Pigeonevine, patient can schedule his own follow up appointment with his Neurologist.

## 2014-10-23 NOTE — Progress Notes (Signed)
Spoke with pt concerning PCP and [facility]. Pt states, "I don't need an doctor.".

## 2015-01-09 ENCOUNTER — Ambulatory Visit
Admission: RE | Admit: 2015-01-09 | Discharge: 2015-01-09 | Disposition: A | Discharge status: Home / Self Care | Payer: No Typology Code available for payment source | Source: Ambulatory Visit | Attending: Infectious Disease | Admitting: Infectious Disease

## 2015-01-09 ENCOUNTER — Other Ambulatory Visit: Payer: Self-pay | Admitting: Infectious Disease

## 2015-01-09 DIAGNOSIS — R7611 Nonspecific reaction to tuberculin skin test without active tuberculosis: Secondary | ICD-10-CM

## 2015-02-06 ENCOUNTER — Ambulatory Visit
Admission: RE | Admit: 2015-02-06 | Discharge: 2015-02-06 | Disposition: A | Discharge status: Home / Self Care | Payer: No Typology Code available for payment source | Source: Ambulatory Visit | Attending: Infectious Disease | Admitting: Infectious Disease

## 2015-02-06 ENCOUNTER — Other Ambulatory Visit: Payer: Self-pay | Admitting: Infectious Disease

## 2015-02-06 DIAGNOSIS — R079 Chest pain, unspecified: Secondary | ICD-10-CM

## 2015-02-08 ENCOUNTER — Emergency Department (HOSPITAL_COMMUNITY)
Admission: EM | Admit: 2015-02-08 | Discharge: 2015-02-08 | Disposition: A | Discharge status: Home / Self Care | Payer: No Typology Code available for payment source | Attending: Emergency Medicine | Admitting: Emergency Medicine

## 2015-02-08 ENCOUNTER — Encounter (HOSPITAL_COMMUNITY): Payer: Self-pay

## 2015-02-08 DIAGNOSIS — S0011XA Contusion of right eyelid and periocular area, initial encounter: Secondary | ICD-10-CM | POA: Diagnosis not present

## 2015-02-08 DIAGNOSIS — Z79899 Other long term (current) drug therapy: Secondary | ICD-10-CM | POA: Diagnosis not present

## 2015-02-08 DIAGNOSIS — Y929 Unspecified place or not applicable: Secondary | ICD-10-CM | POA: Diagnosis not present

## 2015-02-08 DIAGNOSIS — G40909 Epilepsy, unspecified, not intractable, without status epilepticus: Secondary | ICD-10-CM | POA: Diagnosis not present

## 2015-02-08 DIAGNOSIS — Z7952 Long term (current) use of systemic steroids: Secondary | ICD-10-CM | POA: Diagnosis not present

## 2015-02-08 DIAGNOSIS — Z8611 Personal history of tuberculosis: Secondary | ICD-10-CM | POA: Insufficient documentation

## 2015-02-08 DIAGNOSIS — S0083XA Contusion of other part of head, initial encounter: Secondary | ICD-10-CM

## 2015-02-08 DIAGNOSIS — S0081XA Abrasion of other part of head, initial encounter: Secondary | ICD-10-CM | POA: Diagnosis not present

## 2015-02-08 DIAGNOSIS — Y999 Unspecified external cause status: Secondary | ICD-10-CM | POA: Insufficient documentation

## 2015-02-08 DIAGNOSIS — Y939 Activity, unspecified: Secondary | ICD-10-CM | POA: Diagnosis not present

## 2015-02-08 DIAGNOSIS — Z8679 Personal history of other diseases of the circulatory system: Secondary | ICD-10-CM | POA: Insufficient documentation

## 2015-02-08 DIAGNOSIS — S0591XA Unspecified injury of right eye and orbit, initial encounter: Secondary | ICD-10-CM | POA: Diagnosis present

## 2015-02-08 MED ORDER — IBUPROFEN 800 MG PO TABS: 800.0000 mg | ORAL_TABLET | Freq: Three times a day (TID) | ORAL | Status: AC
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
## 2015-02-08 NOTE — Discharge Instructions (Signed)
Contusion °A contusion is a deep bruise. Contusions are the result of an injury that caused bleeding under the skin. The contusion may turn blue, purple, or yellow. Minor injuries will give you a painless contusion, but more severe contusions may stay painful and swollen for a few weeks.  °CAUSES  °A contusion is usually caused by a blow, trauma, or direct force to an area of the body. °SYMPTOMS  °· Swelling and redness of the injured area. °· Bruising of the injured area. °· Tenderness and soreness of the injured area. °· Pain. °DIAGNOSIS  °The diagnosis can be made by taking a history and physical exam. An X-ray, CT scan, or MRI may be needed to determine if there were any associated injuries, such as fractures. °TREATMENT  °Specific treatment will depend on what area of the body was injured. In general, the best treatment for a contusion is resting, icing, elevating, and applying cold compresses to the injured area. Over-the-counter medicines may also be recommended for pain control. Ask your caregiver what the best treatment is for your contusion. °HOME CARE INSTRUCTIONS  °· Put ice on the injured area. °¨ Put ice in a plastic bag. °¨ Place a towel between your skin and the bag. °¨ Leave the ice on for 15-20 minutes, 3-4 times a day, or as directed by your health care provider. °· Only take over-the-counter or prescription medicines for pain, discomfort, or fever as directed by your caregiver. Your caregiver may recommend avoiding anti-inflammatory medicines (aspirin, ibuprofen, and naproxen) for 48 hours because these medicines may increase bruising. °· Rest the injured area. °· If possible, elevate the injured area to reduce swelling. °SEEK IMMEDIATE MEDICAL CARE IF:  °· You have increased bruising or swelling. °· You have pain that is getting worse. °· Your swelling or pain is not relieved with medicines. °MAKE SURE YOU:  °· Understand these instructions. °· Will watch your condition. °· Will get help right  away if you are not doing well or get worse. °Document Released: 04/01/2005 Document Revised: 06/27/2013 Document Reviewed: 04/27/2011 °ExitCare® Patient Information ©2015 [employer]. This information is not intended to replace advice given to you by your health care provider. Make sure you discuss any questions you have with your health care provider. ° °

## 2015-02-10 ENCOUNTER — Encounter (HOSPITAL_COMMUNITY): Payer: Self-pay

## 2015-02-10 ENCOUNTER — Emergency Department (HOSPITAL_COMMUNITY)
Admission: EM | Admit: 2015-02-10 | Discharge: 2015-02-10 | Disposition: A | Discharge status: Home / Self Care | Payer: No Typology Code available for payment source | Attending: Emergency Medicine | Admitting: Emergency Medicine

## 2015-02-10 ENCOUNTER — Emergency Department (HOSPITAL_COMMUNITY): Payer: No Typology Code available for payment source

## 2015-02-10 DIAGNOSIS — Z791 Long term (current) use of non-steroidal anti-inflammatories (NSAID): Secondary | ICD-10-CM | POA: Insufficient documentation

## 2015-02-10 DIAGNOSIS — Z792 Long term (current) use of antibiotics: Secondary | ICD-10-CM | POA: Diagnosis not present

## 2015-02-10 DIAGNOSIS — Z7952 Long term (current) use of systemic steroids: Secondary | ICD-10-CM | POA: Diagnosis not present

## 2015-02-10 DIAGNOSIS — G40909 Epilepsy, unspecified, not intractable, without status epilepticus: Secondary | ICD-10-CM | POA: Diagnosis not present

## 2015-02-10 DIAGNOSIS — R079 Chest pain, unspecified: Secondary | ICD-10-CM | POA: Diagnosis present

## 2015-02-10 DIAGNOSIS — R0789 Other chest pain: Secondary | ICD-10-CM | POA: Insufficient documentation

## 2015-02-10 DIAGNOSIS — R220 Localized swelling, mass and lump, head: Secondary | ICD-10-CM | POA: Diagnosis not present

## 2015-02-10 DIAGNOSIS — Z8679 Personal history of other diseases of the circulatory system: Secondary | ICD-10-CM | POA: Insufficient documentation

## 2015-02-10 DIAGNOSIS — Z8611 Personal history of tuberculosis: Secondary | ICD-10-CM | POA: Insufficient documentation

## 2015-02-10 DIAGNOSIS — Z79899 Other long term (current) drug therapy: Secondary | ICD-10-CM | POA: Insufficient documentation

## 2015-02-10 LAB — BASIC METABOLIC PANEL
Anion gap: 9 (ref 5–15)
BUN: 8 mg/dL (ref 6–20)
CALCIUM: 9.4 mg/dL (ref 8.9–10.3)
CO2: 25 mmol/L (ref 22–32)
Chloride: 101 mmol/L (ref 101–111)
Creatinine, Ser: 0.76 mg/dL (ref 0.61–1.24)
Glucose, Bld: 100 mg/dL — ABNORMAL HIGH (ref 65–99)
POTASSIUM: 4 mmol/L (ref 3.5–5.1)
Sodium: 135 mmol/L (ref 135–145)

## 2015-02-10 LAB — CBC
HCT: 38.4 % — ABNORMAL LOW (ref 39.0–52.0)
Hemoglobin: 13 g/dL (ref 13.0–17.0)
MCH: 32.4 pg (ref 26.0–34.0)
MCHC: 33.9 g/dL (ref 30.0–36.0)
MCV: 95.8 fL (ref 78.0–100.0)
Platelets: 217 10*3/uL (ref 150–400)
RBC: 4.01 MIL/uL — ABNORMAL LOW (ref 4.22–5.81)
RDW: 14.9 % (ref 11.5–15.5)
WBC: 6 10*3/uL (ref 4.0–10.5)

## 2015-02-10 LAB — I-STAT TROPONIN, ED: Troponin i, poc: 0 ng/mL (ref 0.00–0.08)
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
## 2015-02-10 NOTE — Discharge Instructions (Signed)
Chest Wall Pain Chest wall pain is pain felt in or around the chest bones and muscles. It may take up to 6 weeks to get better. It may take longer if you are active. Chest wall pain can happen on its own. Other times, things like germs, injury, coughing, or exercise can cause the pain. HOME CARE   Avoid activities that make you tired or cause pain. Try not to use your chest, belly (abdominal), or side muscles. Do not use heavy weights.  Put ice on the sore area.  Put ice in a plastic bag.  Place a towel between your skin and the bag.  Leave the ice on for 15-20 minutes for the first 2 days.  Only take medicine as told by your doctor. GET HELP RIGHT AWAY IF:   You have more pain or are very uncomfortable.  You have a fever.  Your chest pain gets worse.  You have new problems.  You feel sick to your stomach (nauseous) or throw up (vomit).  You start to sweat or feel lightheaded.  You have a cough with mucus (phlegm).  You cough up blood. MAKE SURE YOU:   Understand these instructions.  Will watch your condition.  Will get help right away if you are not doing well or get worse. Document Released: 12/09/2007 Document Revised: 09/14/2011 Document Reviewed: 02/16/2011 [facility] Patient Information 2015 [place], [place]. This information is not intended to replace advice given to you by your health care provider. Make sure you discuss any questions you have with your health care provider.  Return for any new or worse symptoms particular increased shortness of breath or development of fever. Continue to take the Motrin as directed.
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
## 2015-02-23 ENCOUNTER — Encounter (HOSPITAL_COMMUNITY): Payer: Self-pay

## 2015-02-23 ENCOUNTER — Emergency Department (HOSPITAL_COMMUNITY)
Admission: EM | Admit: 2015-02-23 | Discharge: 2015-02-23 | Disposition: A | Discharge status: Home / Self Care | Payer: Self-pay | Attending: Emergency Medicine | Admitting: Emergency Medicine

## 2015-02-23 DIAGNOSIS — Z7952 Long term (current) use of systemic steroids: Secondary | ICD-10-CM | POA: Insufficient documentation

## 2015-02-23 DIAGNOSIS — Z8679 Personal history of other diseases of the circulatory system: Secondary | ICD-10-CM | POA: Insufficient documentation

## 2015-02-23 DIAGNOSIS — G40909 Epilepsy, unspecified, not intractable, without status epilepticus: Secondary | ICD-10-CM | POA: Insufficient documentation

## 2015-02-23 DIAGNOSIS — Z79899 Other long term (current) drug therapy: Secondary | ICD-10-CM | POA: Insufficient documentation

## 2015-02-23 DIAGNOSIS — Z8611 Personal history of tuberculosis: Secondary | ICD-10-CM | POA: Insufficient documentation

## 2015-02-23 DIAGNOSIS — H538 Other visual disturbances: Secondary | ICD-10-CM | POA: Insufficient documentation

## 2015-02-23 DIAGNOSIS — H539 Unspecified visual disturbance: Secondary | ICD-10-CM
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
## 2015-02-23 NOTE — Discharge Instructions (Signed)
Blurred Vision °You have been seen today complaining of blurred vision. This means you have a loss of ability to see small details.  °CAUSES  °Blurred vision can be a symptom of underlying eye problems, such as: °· Aging of the eye (presbyopia). °· Glaucoma. °· Cataracts. °· Eye infection. °· Eye-related migraine. °· Diabetes mellitus. °· Fatigue. °· Migraine headaches. °· High blood pressure. °· Breakdown of the back of the eye (macular degeneration). °· Problems caused by some medications. °The most common cause of blurred vision is the need for eyeglasses or a new prescription. Today in the emergency department, no cause for your blurred vision can be found. °SYMPTOMS  °Blurred vision is the loss of visual sharpness and detail (acuity). °DIAGNOSIS  °Should blurred vision continue, you should see your caregiver. If your caregiver is your primary care physician, he or she may choose to refer you to another specialist.  °TREATMENT  °Do not ignore your blurred vision. Make sure to have it checked out to see if further treatment or referral is necessary. °SEEK MEDICAL CARE IF:  °You are unable to get into a specialist so we can help you with a referral. °SEEK IMMEDIATE MEDICAL CARE IF: °You have severe eye pain, severe headache, or sudden loss of vision. °MAKE SURE YOU:  °· Understand these instructions. °· Will watch your condition. °· Will get help right away if you are not doing well or get worse. °Document Released: 06/25/2003 Document Revised: 09/14/2011 Document Reviewed: 01/25/2008 °Exit[employer]® Patient Information ©2015 [employer]. This information is not intended to replace advice given to you by your health care provider. Make sure you discuss any questions you have with your health care provider. ° °

## 2016-10-14 IMAGING — CR DG CHEST 2V
2 series · 2 of 2 positions shown · non-contrast
Comparison: None.

CLINICAL DATA: Syncope.  Emesis.

EXAM:
CHEST  2 VIEW

[w chest pa]
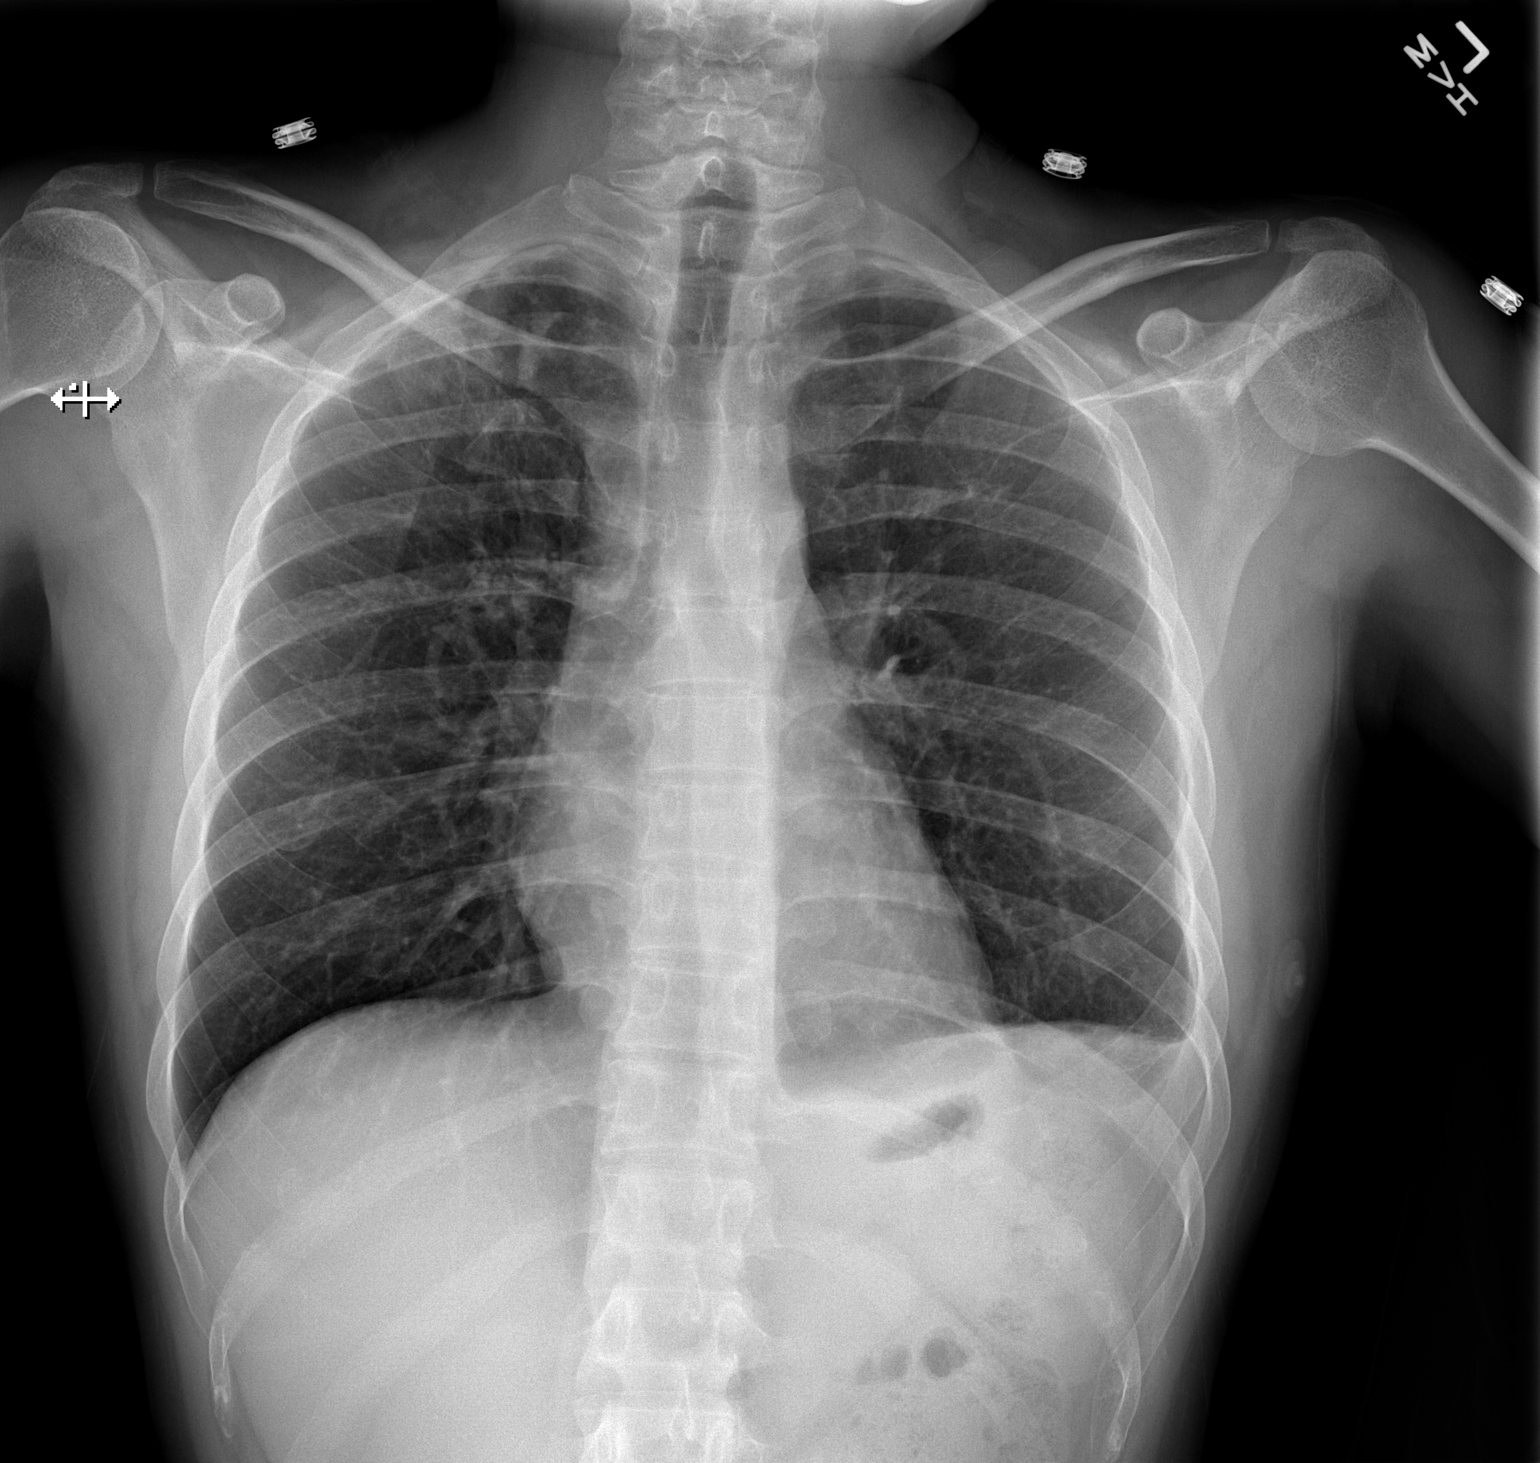

[w chest lat]
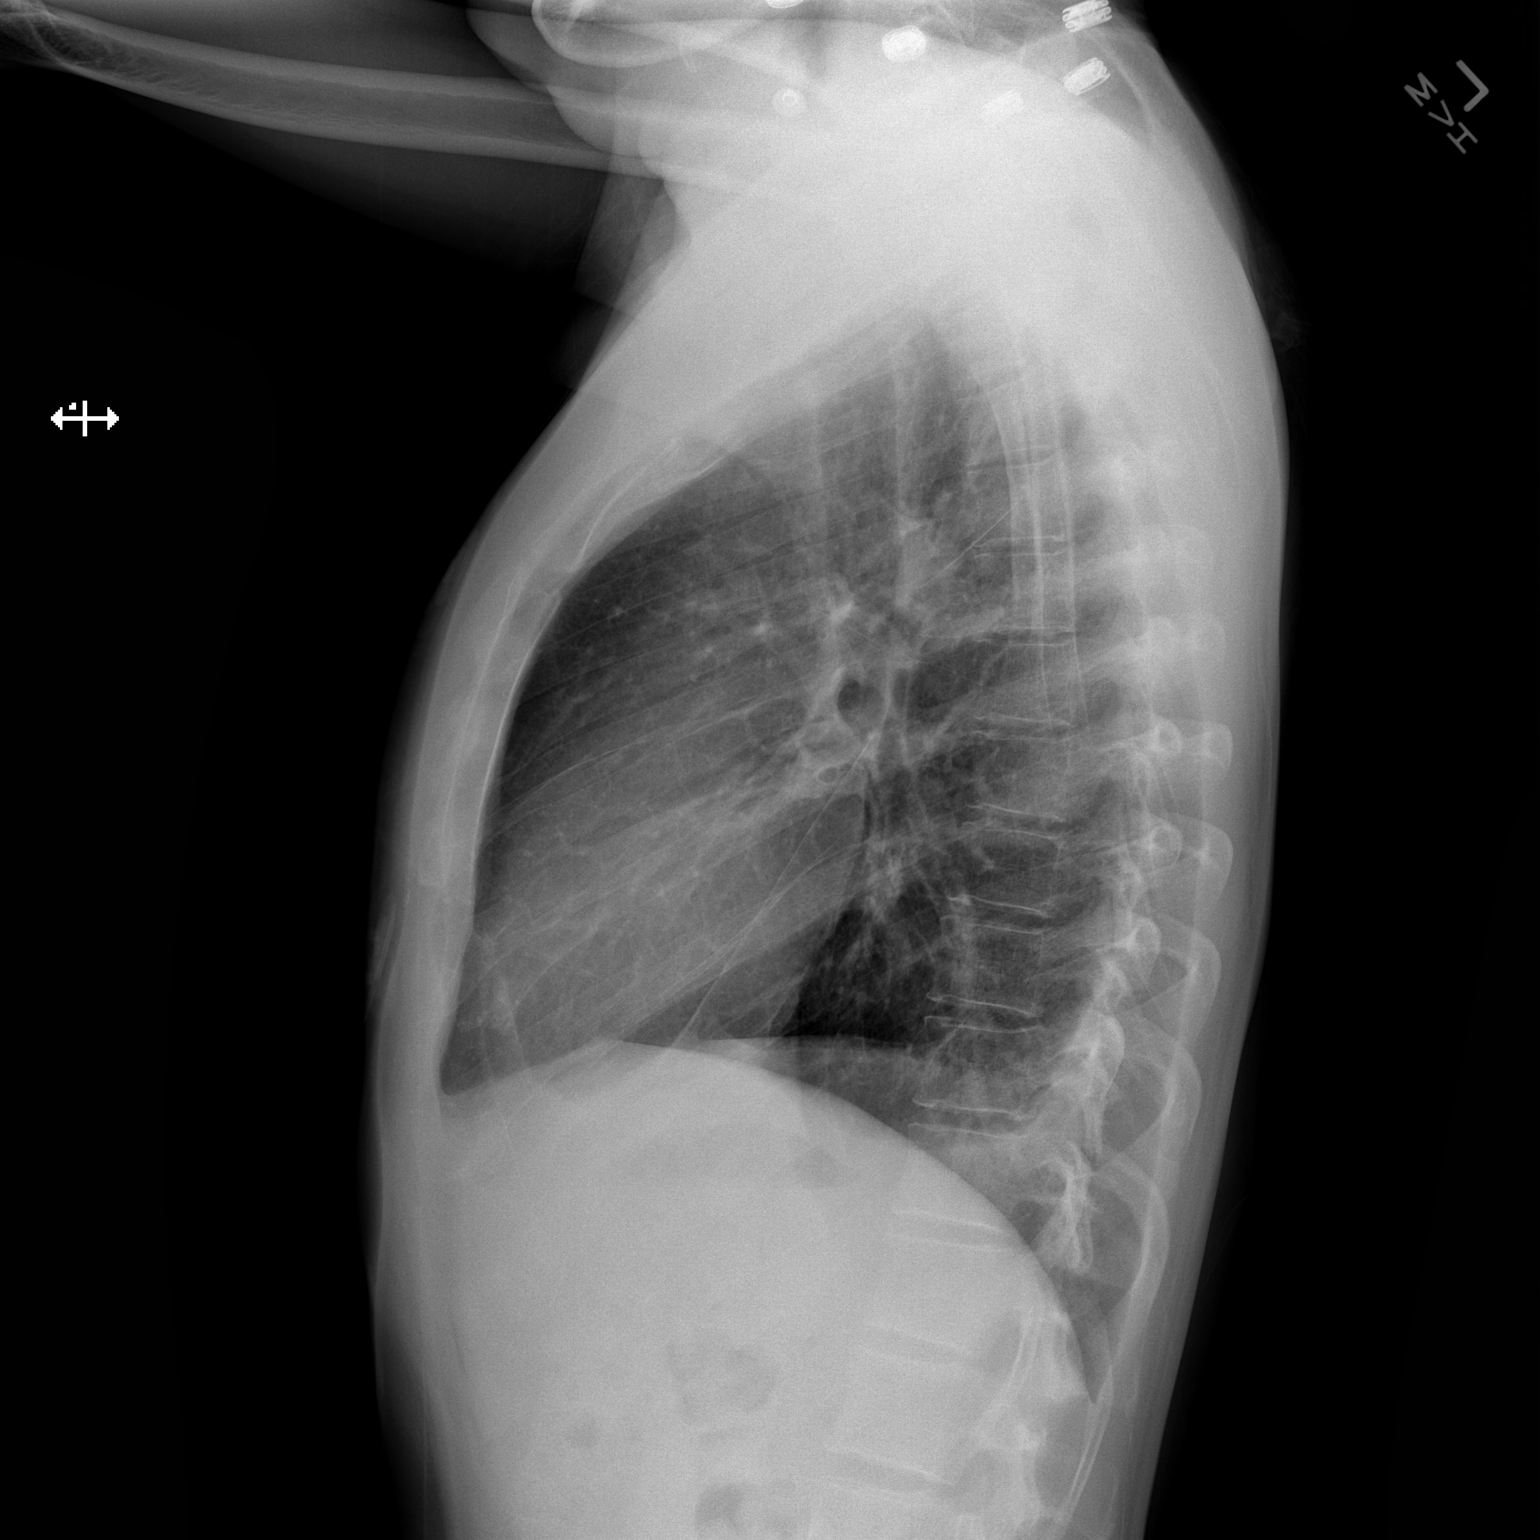

[2 of 2 positions shown; findings below may reference images not displayed]

FINDINGS: The cardiomediastinal silhouette is within normal limits. The lungs
are well inflated. There is mild, asymmetric parenchymal lung
opacity in the right lung apex, likely with some associated pleural
thickening. There is blunting of the left costophrenic angle
consistent with a small left pleural effusion. No left lung
consolidation is seen. No pneumothorax. No acute osseous abnormality
identified.
IMPRESSION: 1. Small left pleural effusion.
2. Asymmetric, mild pleural parenchymal opacities in the right lung
apex, most likely scarring related to prior infection. Attention on
follow-up. Comparison with any prior studies, if available, would
also be helpful to assess stability.

## 2017-01-01 IMAGING — CR DG CHEST 1V
1 series · 1 of 1 positions shown · non-contrast
Comparison: PA and lateral chest x-ray October 22, 2014

CLINICAL DATA: Two month status post tuberculosis treatment;
sensation of fluid in the left hemi thorax, dyspnea on exertion.

EXAM:
CHEST  1 VIEW

[w chest pa]
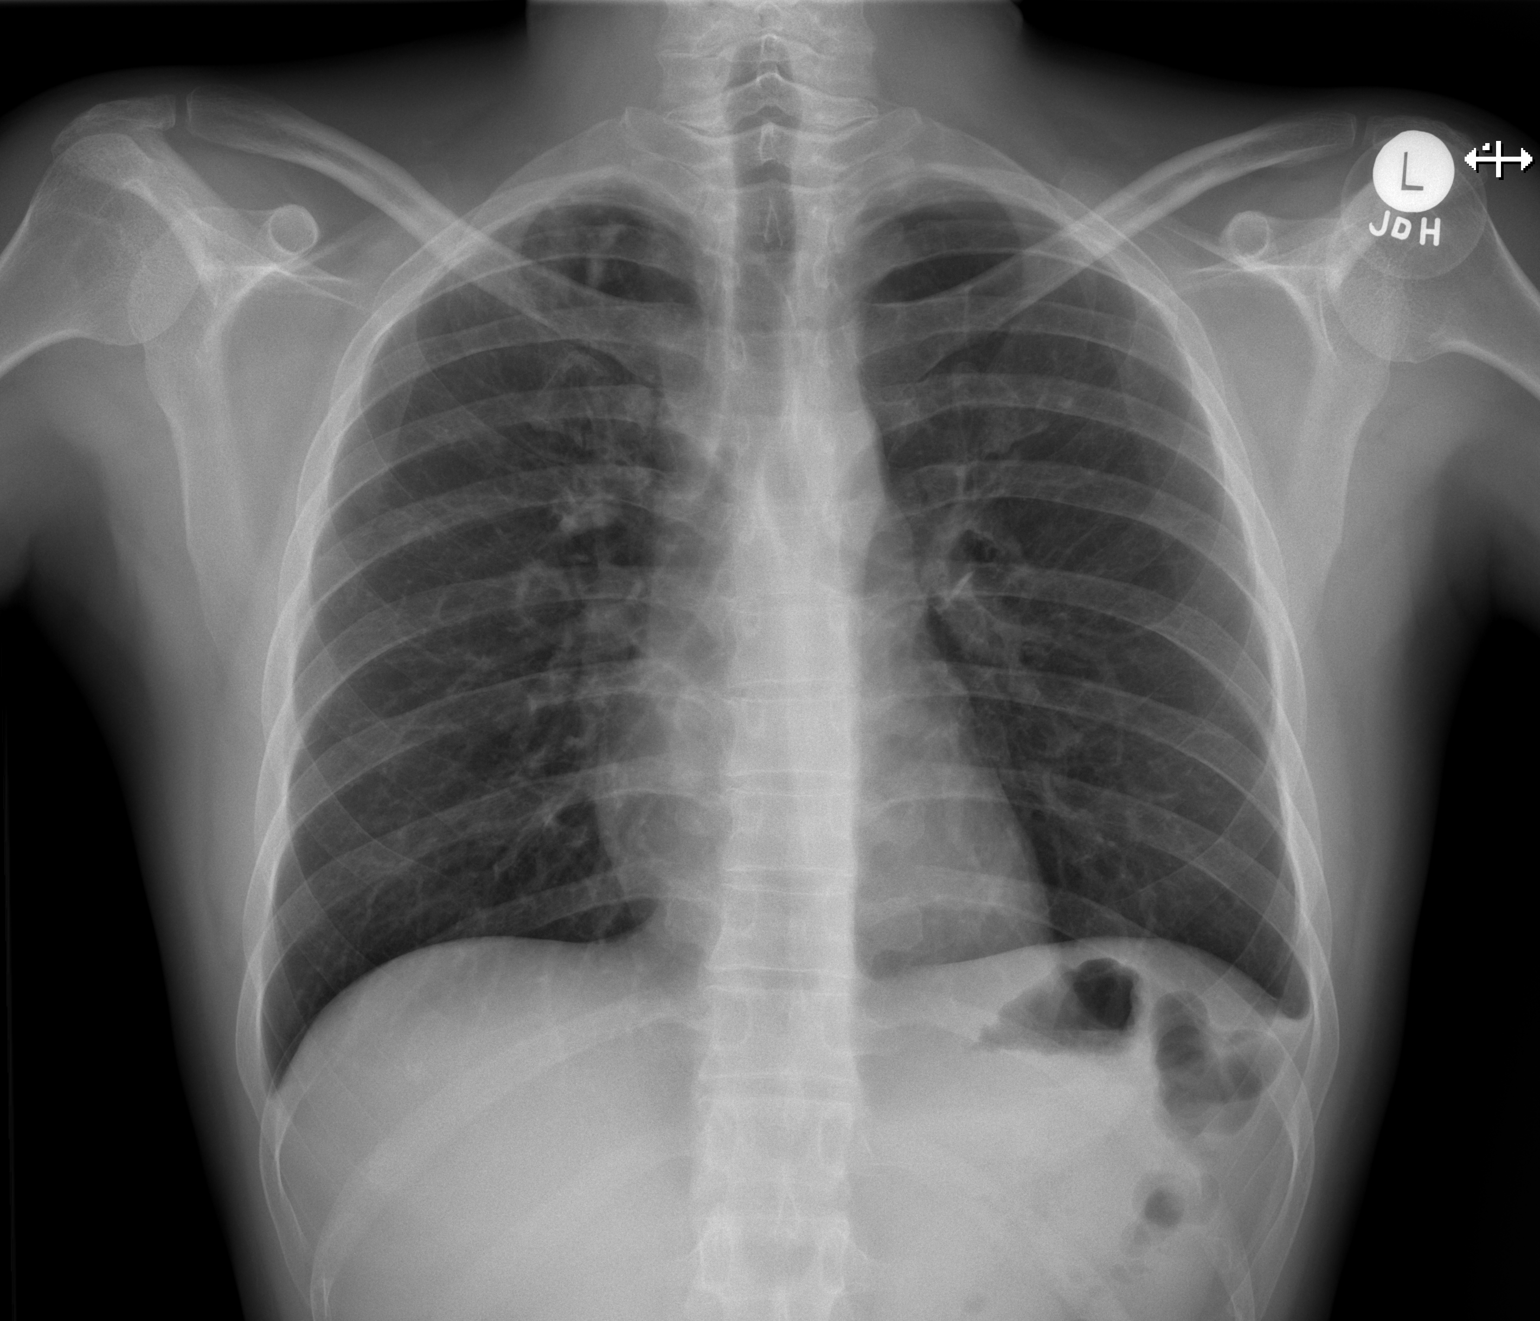

[1 of 1 positions shown; findings below may reference images not displayed]

FINDINGS: The lungs are adequately inflated. A tiny left pleural effusion
persists. The heart and pulmonary vascularity are normal. The
mediastinum is normal in width. There is no alveolar infiltrate. The
interstitial markings of both lungs are coarse. There is stable
linear density in the right pulmonary apex. The bony thorax exhibits
no acute abnormality.
IMPRESSION: Persistent trace left pleural effusion versus pleural thickening.
Stable linear density in the right upper lobe likely reflecting
scarring. No new abnormalities are demonstrated.
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
